# Patient Record
Sex: Male | Born: 1990 | Race: White | Hispanic: No | Marital: Single | Smoking: Never smoker
Health system: Southern US, Community
[De-identification: ages and names within clinical notes are randomized; demographics above are authoritative.]

## PROBLEM LIST (undated history)

## (undated) HISTORY — PX: MENISCUS REPAIR: SHX5179

---

## 2014-04-07 ENCOUNTER — Emergency Department (HOSPITAL_COMMUNITY): Payer: BC Managed Care – PPO

## 2014-04-07 ENCOUNTER — Emergency Department (HOSPITAL_COMMUNITY)
Admission: EM | Admit: 2014-04-07 | Discharge: 2014-04-07 | Disposition: A | Discharge status: Home / Self Care | Payer: BC Managed Care – PPO | Attending: Emergency Medicine | Admitting: Emergency Medicine

## 2014-04-07 ENCOUNTER — Encounter (HOSPITAL_COMMUNITY): Payer: Self-pay | Admitting: Emergency Medicine

## 2014-04-07 DIAGNOSIS — S61409A Unspecified open wound of unspecified hand, initial encounter: Secondary | ICD-10-CM | POA: Diagnosis not present

## 2014-04-07 DIAGNOSIS — Y9389 Activity, other specified: Secondary | ICD-10-CM | POA: Diagnosis not present

## 2014-04-07 DIAGNOSIS — S6390XA Sprain of unspecified part of unspecified wrist and hand, initial encounter: Secondary | ICD-10-CM | POA: Insufficient documentation

## 2014-04-07 DIAGNOSIS — S61412A Laceration without foreign body of left hand, initial encounter: Secondary | ICD-10-CM

## 2014-04-07 DIAGNOSIS — S6990XA Unspecified injury of unspecified wrist, hand and finger(s), initial encounter: Secondary | ICD-10-CM | POA: Insufficient documentation

## 2014-04-07 DIAGNOSIS — W010XXA Fall on same level from slipping, tripping and stumbling without subsequent striking against object, initial encounter: Secondary | ICD-10-CM | POA: Insufficient documentation

## 2014-04-07 DIAGNOSIS — W108XXA Fall (on) (from) other stairs and steps, initial encounter: Secondary | ICD-10-CM | POA: Insufficient documentation

## 2014-04-07 DIAGNOSIS — Y9289 Other specified places as the place of occurrence of the external cause: Secondary | ICD-10-CM | POA: Diagnosis not present

## 2014-04-07 DIAGNOSIS — S63602A Unspecified sprain of left thumb, initial encounter: Secondary | ICD-10-CM

## 2014-04-07 MED ORDER — IBUPROFEN 600 MG PO TABS: 600.0000 mg | ORAL_TABLET | Freq: Four times a day (QID) | ORAL | Status: AC | PRN

## 2014-04-07 NOTE — ED Notes (Signed)
Pain assessment should say L thumb

## 2014-04-07 NOTE — ED Provider Notes (Signed)
Medical screening examination/treatment/procedure(s) were performed by non-physician practitioner and as supervising physician I was immediately available for consultation/collaboration.   EKG Interpretation None        Michoel Kunin RGlynn Octave9/27/15 [phone]

## 2014-04-07 NOTE — ED Notes (Signed)
Pt states he was walking down wet stairs when he slipped and fell, landing on L thumb. Pt unable to move L thumb, swelling noted to area. Small amount of bleeding noted around nail bed.

## 2014-04-07 NOTE — ED Provider Notes (Signed)
CSN: [id number]     Arrival date & time 04/07/14  2048 History   None    This chart was scribed for non-physician practitioner, Antony Madura, PA-C working with No att. providers found by Arlan Organ, ED Scribe. This Brewer was seen in room WTR7/WTR7 and the Brewer's care was started at 10:58 PM.   Chief Complaint  Brewer presents with  . Hand Pain   The history is provided by the Brewer. No language interpreter was used.    HPI Comments: Bradley Brewer is a 23 y.o. male who presents to the Emergency Department complaining of constant, moderate, non-radiating pain to the L thumb onset just prior to arrival. He describes pain as throbbing.  Small amount of bleeding noted around nail bed. Pt states he was walking outside when he slipped on a wet staircase resulting in him falling and landing on his thumb. Pain is exacerbated with certain movements and alleviated with elevation. [name] has tried OTC Ibuprofen with mild improvement for discomfort. He denies any numbness, loss of sensation, or paresthesia to the thumb. Tetanus is UTD.  History reviewed. No pertinent past medical history. Past Surgical History  Procedure Laterality Date  . Meniscus repair Right    No family history on file. History  Substance Use Topics  . Smoking status: Never Smoker   . Smokeless tobacco: Not on file  . Alcohol Use: Yes     Comment: occasional    Review of Systems  Musculoskeletal: Positive for arthralgias.  Neurological: Negative for weakness and numbness.  All other systems reviewed and are negative.   Allergies  Review of Brewer's allergies indicates no known allergies.  Home Medications   Prior to Admission medications   Medication Sig Start Date End Date Taking? Authorizing Provider  ibuprofen (ADVIL,MOTRIN) 600 MG tablet Take 1 tablet (600 mg total) by mouth every 6 (six) hours as needed. 04/07/14   Antony Madura, PA-C   Triage Vitals: BP 161/73  Pulse 73  Temp(Src) 99.3 F (37.4 C)  (Oral)  Resp 18  Ht  (1.905 m)  Wt 225 lb (102.059 kg)  BMI 28.12 kg/m2  SpO2 100%   Physical Exam  Nursing note and vitals reviewed. Constitutional: He is oriented to person, place, and time. He appears well-developed and well-nourished. No distress.  Nontoxic/nonseptic appearing  HENT:  Head: Normocephalic and atraumatic.  Eyes: Conjunctivae and EOM are normal. No scleral icterus.  Neck: Normal range of motion. Neck supple.  Cardiovascular: Normal rate, regular rhythm and intact distal pulses.   Distal radial pulse 2+ in left upper extremity. Capillary refill brisk in all digits of left hand  Pulmonary/Chest: Effort normal. No respiratory distress.  Musculoskeletal: Normal range of motion.  Normal passive range of motion of left thumb. Decreased active range of motion of left thumb secondary to pain. Brewer has 5/5 strength against resistance flexors and extensors of of left hand. There is a skin tear appreciated to the lateral aspect of the nail of Brewer's L thumb.  Neurological: He is alert and oriented to person, place, and time. He exhibits normal muscle tone. Coordination normal.  Sensation to light touch intact. Finger to thumb opposition intact. Brewer able to wiggle all fingers of left hand  Skin: Skin is warm and dry. No rash noted. He is not diaphoretic. No erythema. No pallor.  Psychiatric: He has a normal mood and affect. His behavior is normal.    ED Course  Procedures (including critical care time)  DIAGNOSTIC STUDIES: Oxygen  Saturation is 100% on RA, Normal by my interpretation.    COORDINATION OF CARE: 10:58 PM- Will order DG Finger Thumb L. Discussed treatment plan with pt at bedside and pt agreed to plan.     Labs Review Labs Reviewed - No data to display  Imaging Review Dg Finger Thumb Left  04/07/2014   CLINICAL DATA:  Left thumb pain after fall.  EXAM: LEFT THUMB 2+V  COMPARISON:  None.  FINDINGS: There is no evidence of fracture or dislocation.  There is no evidence of arthropathy or other focal bone abnormality. Soft tissues are unremarkable  IMPRESSION: Normal left thumb.   Electronically Signed   By: Roque Lias M.D.   On: 04/07/2014 21:09     EKG Interpretation None      MDM   Final diagnoses:  Left thumb sprain, initial encounter  Skin tear of hand without complication, left, initial encounter    23 year old male presents to the emergency department for further evaluation of left thumb injury after he slipped on the stairs. Brewer neurovascularly intact on exam. No crepitus, deformity, or effusion. Brewer has a skin tear noted to the lateral aspect of his left first finger. Imaging shows no evidence of fracture, dislocation, or bony deformity. Brewer given finger splint for likely thumb sprain; wound care provided. Brewer stable and appropriate for discharge with instruction to return if symptoms worsen or signs of infection develop. Brewer agreeable to plan with no unaddressed concerns.  I personally performed the services described in this documentation, which was scribed in my presence. The recorded information has been reviewed and is accurate.    Filed Vitals:   04/07/14 2054  BP: 161/73  Pulse: 73  Temp: 99.3 F (37.4 C)  TempSrc: Oral  Resp: 18  Height:  (1.905 m)  Weight: 225 lb (102.059 kg)  SpO2: 100%     Antony Madura, PA-C 04/07/14 2308

## 2014-04-07 NOTE — Discharge Instructions (Signed)
Recommend ice, splinting for stability, and ibuprofen for symptoms. Follow up with your doctor to ensure resolution of symptoms. Return as needed if symptoms worsen.  Finger Sprain A finger sprain is a tear in one of the strong, fibrous tissues that connect the bones (ligaments) in your finger. The severity of the sprain depends on how much of the ligament is torn. The tear can be either partial or complete. CAUSES  Often, sprains are a result of a fall or accident. If you extend your hands to catch an object or to protect yourself, the force of the impact causes the fibers of your ligament to stretch too much. This excess tension causes the fibers of your ligament to tear. SYMPTOMS  You may have some loss of motion in your finger. Other symptoms include:  Bruising.  Tenderness.  Swelling. DIAGNOSIS  In order to diagnose finger sprain, your caregiver will physically examine your finger or thumb to determine how torn the ligament is. Your caregiver may also suggest an X-ray exam of your finger to make sure no bones are broken. TREATMENT  If your ligament is only partially torn, treatment usually involves keeping the finger in a fixed position (immobilization) for a short period. To do this, your caregiver will apply a bandage, cast, or splint to keep your finger from moving until it heals. For a partially torn ligament, the healing process usually takes 2 to 3 weeks. If your ligament is completely torn, you may need surgery to reconnect the ligament to the bone. After surgery a cast or splint will be applied and will need to stay on your finger or thumb for 4 to 6 weeks while your ligament heals. HOME CARE INSTRUCTIONS  Keep your injured finger elevated, when possible, to decrease swelling.  To ease pain and swelling, apply ice to your joint twice a day, for 2 to 3 days:  Put ice in a plastic bag.  Place a towel between your skin and the bag.  Leave the ice on for 15 minutes.  Only take  over-the-counter or prescription medicine for pain as directed by your caregiver.  Do not wear rings on your injured finger.  Do not leave your finger unprotected until pain and stiffness go away (usually 3 to 4 weeks).  Do not allow your cast or splint to get wet. Cover your cast or splint with a plastic bag when you shower or bathe. Do not swim.  Your caregiver may suggest special exercises for you to do during your recovery to prevent or limit permanent stiffness. SEEK IMMEDIATE MEDICAL CARE IF:  Your cast or splint becomes damaged.  Your pain becomes worse rather than better. MAKE SURE YOU:  Understand these instructions.  Will watch your condition.  Will get help right away if you are not doing well or get worse. Document Released: 08/05/2004 Document Revised: 09/20/2011 Document Reviewed: 03/01/2011 [facility] Patient Information 2015 [place], [place]. This information is not intended to replace advice given to you by your health care provider. Make sure you discuss any questions you have with your health care provider.

## 2014-10-19 ENCOUNTER — Emergency Department (HOSPITAL_COMMUNITY)
Admission: EM | Admit: 2014-10-19 | Discharge: 2014-10-19 | Disposition: A | Discharge status: Home / Self Care | Payer: BC Managed Care – PPO | Attending: Emergency Medicine | Admitting: Emergency Medicine

## 2014-10-19 ENCOUNTER — Encounter (HOSPITAL_COMMUNITY): Payer: Self-pay | Admitting: Emergency Medicine

## 2014-10-19 ENCOUNTER — Emergency Department (HOSPITAL_COMMUNITY): Payer: BC Managed Care – PPO

## 2014-10-19 DIAGNOSIS — S61319A Laceration without foreign body of unspecified finger with damage to nail, initial encounter: Secondary | ICD-10-CM

## 2014-10-19 DIAGNOSIS — Y998 Other external cause status: Secondary | ICD-10-CM | POA: Diagnosis not present

## 2014-10-19 DIAGNOSIS — Y9289 Other specified places as the place of occurrence of the external cause: Secondary | ICD-10-CM | POA: Insufficient documentation

## 2014-10-19 DIAGNOSIS — S61312A Laceration without foreign body of right middle finger with damage to nail, initial encounter: Secondary | ICD-10-CM | POA: Insufficient documentation

## 2014-10-19 DIAGNOSIS — W2107XA Struck by softball, initial encounter: Secondary | ICD-10-CM | POA: Diagnosis not present

## 2014-10-19 DIAGNOSIS — S6991XA Unspecified injury of right wrist, hand and finger(s), initial encounter: Secondary | ICD-10-CM | POA: Diagnosis present

## 2014-10-19 DIAGNOSIS — Y9364 Activity, baseball: Secondary | ICD-10-CM | POA: Diagnosis not present

## 2014-10-19 MED ORDER — BUPIVACAINE HCL (PF) 0.5 % IJ SOLN
10.0000 mL | Freq: Once | INTRAMUSCULAR | Status: AC
  Administered 2014-10-19: 10 mL
  Filled 2014-10-19: qty 10

## 2014-10-19 MED ORDER — NAPROXEN 500 MG PO TABS: 500.0000 mg | ORAL_TABLET | Freq: Two times a day (BID) | ORAL | Status: AC

## 2014-10-19 NOTE — ED Notes (Signed)
Contacted X-Ray, per A. Harris PA-C.

## 2014-10-19 NOTE — ED Notes (Signed)
Patient here with complaint of right middle finger injury from being struck by baseball. Skin appears to have split beneath nail. Bleeding controlled at this time. Patient able to move finger freely without pain. No apparent swelling.

## 2014-10-19 NOTE — Discharge Instructions (Signed)
Fingernail Removal Fingernails may need to be removed because of injury, infections, or correction of abnormal growth. A special non-stick bandage has been put on your finger tightly to prevent bleeding. Fingernails will usually grow back if the finger has not been badly injured and you carefully follow instructions. HOME CARE INSTRUCTIONS   Keep your hand elevated above your heart to relieve pain and swelling.  Keep your dressing dry and clean.  Change your bandage in 24 hours.  After your bandage is changed, soak your hand in warm soapy water for 10 to 20 minutes. Do this 3 times per day. This helps reduce pain and swelling. After soaking your hand, apply a clean, dry bandage. Change your bandage if it is wet or dirty.  Only take over-the-counter or prescription medicines for pain, discomfort, or fever as directed by your caregiver.  See your caregiver as needed for problems.  You may have received an instruction to follow up with your caregiver or a specialist. The failure to follow up as instructed could result in the permanent loss of a fingernail. SEEK IMMEDIATE MEDICAL CARE IF:   You have increased pain, swelling, drainage, or bleeding.  You have a fever. MAKE SURE YOU:   Understand these instructions.  Will watch your condition.  Will get help right away if you are not doing well or get worse. Document Released: 06/25/2000 Document Revised: 09/20/2011 Document Reviewed: 10/31/2007 [facility] Patient Information 2015 [place]. This information is not intended to replace advice given to you by your health care provider. Make sure you discuss any questions you have with your health care provider.  Fingertip Injuries and Amputations Fingertip injuries are common and often get injured because they are last to escape when pulling your hand out of harm's way. You have amputated (cut off) part of your finger. How this turns out depends largely on how much was amputated. If just  the tip is amputated, often the end of the finger will grow back and the finger may return to much the same as it was before the injury.  If more of the finger is missing, your caregiver has done the best with the tissue remaining to allow you to keep as much finger as is possible. Your caregiver after checking your injury has tried to leave you with a painless fingertip that has durable, feeling skin. If possible, your caregiver has tried to maintain the finger's length and appearance and preserve its fingernail.  Please read the instructions outlined below and refer to this sheet in the next few weeks. These instructions provide you with general information on caring for yourself. Your caregiver may also give you specific instructions. While your treatment has been done according to the most current medical practices available, unavoidable complications occasionally occur. If you have any problems or questions after discharge, please call your caregiver. HOME CARE INSTRUCTIONS   You may resume normal diet and activities as directed or allowed.  Keep your hand elevated above the level of your heart. This helps decrease pain and swelling.  Keep ice packs (or a bag of ice wrapped in a towel) on the injured area for 15-20 minutes, 03-04 times per day, for the first two days.  Change dressings if necessary or as directed.  Clean the wound daily or as directed.  Only take over-the-counter or prescription medicines for pain, discomfort, or fever as directed by your caregiver.  Keep appointments as directed. SEEK IMMEDIATE MEDICAL CARE IF:  You develop redness, swelling, numbness or  increasing pain in the wound.  There is pus coming from the wound.  You develop an unexplained oral temperature above 102 F (38.9 C) or as your caregiver suggests.  There is a foul (bad) smell coming from the wound or dressing.  There is a breaking open of the wound (edges not staying together) after sutures or  staples have been removed. MAKE SURE YOU:   Understand these instructions.  Will watch your condition.  Will get help right away if you are not doing well or get worse. Document Released: 05/19/2005 Document Revised: 09/20/2011 Document Reviewed: 04/17/2008 [facility] Patient Information 2015 [place], [place]. This information is not intended to replace advice given to you by your health care provider. Make sure you discuss any questions you have with your health care provider.

## 2014-10-19 NOTE — ED Provider Notes (Signed)
CSN: [id number]     Arrival date & time 10/19/14  1915 History   First MD Initiated Contact with Patient 10/19/14 2003     Chief Complaint  Patient presents with  . Finger Injury     (Consider location/radiation/quality/duration/timing/severity/associated sxs/prior Treatment) The history is provided by the patient. No language interpreter was used.  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 The patient was playing in a game today when a ball flew past the distal tip of his right middle finger. Patient suffered a laceration involving the hyponychium and the distal end of the patient's finger. He denies any pain in the fingertip. He denies numbness or tingling. The injury occurred about 6 hours prior to arrival. He is up-to-date on his tetanus vaccination.  History reviewed. No pertinent past medical history. Past Surgical History  Procedure Laterality Date  . Meniscus repair Right    History reviewed. No pertinent family history. History  Substance Use Topics  . Smoking status: Never Smoker   . Smokeless tobacco: Not on file  . Alcohol Use: Yes     Comment: occasional    Review of Systems  Constitutional: Negative for fever and chills.  Gastrointestinal: Negative for nausea and vomiting.  Skin: Positive for wound. Negative for color change.  Neurological: Negative for weakness and numbness.      Allergies  Review of patient's allergies indicates no known allergies.  Home Medications   Prior to Admission medications   Medication Sig Start Date End Date Taking? Authorizing Provider  ibuprofen (ADVIL,MOTRIN) 600 MG tablet Take 1 tablet (600 mg total) by mouth every 6 (six) hours as needed. 04/07/14   Antony Madura, PA-C   BP 121/66 mmHg  Pulse 81  Temp(Src) 98.4 F (36.9 C) (Oral)  Resp 14  SpO2 97% Physical Exam  Constitutional: He is oriented to person, place, and time. He appears well-developed and well-nourished. No distress.   HENT:  Head: Normocephalic and atraumatic.  Eyes: Conjunctivae are normal. No scleral icterus.  Neck: Normal range of motion. Neck supple.  Cardiovascular: Normal rate, regular rhythm and normal heart sounds.   Pulmonary/Chest: Effort normal and breath sounds normal. No respiratory distress.  Abdominal: Soft. There is no tenderness.  Musculoskeletal: He exhibits no edema.  Neurological: He is alert and oriented to person, place, and time.  Skin: Skin is warm and dry. He is not diaphoretic.  1.5 cm avulsion/laceration  Injury if the distal middle Right finger. The distal tip avulsed away from the hypochium . The laceration is under the free edge of the nail and involves the nail bed. No visible bone. No tenderness to palpation of the bony portions of the finger. NVI  Psychiatric: His behavior is normal.  Nursing note and vitals reviewed.   ED Course  NAIL REMOVAL Date/Time: 10/19/2014 10:10 PM Performed by: Arthor Captain Authorized by: Arthor Captain Consent: Verbal consent obtained. Risks and benefits: risks, benefits and alternatives were discussed Consent given by: patient Patient identity confirmed: verbally with patient Location: right hand Location details: right long finger Anesthesia: digital block Local anesthetic: bupivacaine 0.5% with epinephrine Anesthetic total: 8 ml Patient sedated: no Preparation: skin prepped with ChloraPrep Amount removed: complete Wedge excision of skin of nail fold: no Nail bed sutured: yes Suture material: 6-0 Chromic gut Number of sutures: 3 Nail matrix removed: complete Removed nail replaced and anchored: yes (nail glued with skin glue) Dressing: Xeroform gauze Patient tolerance: Patient tolerated the procedure well with no  immediate complications  LACERATION REPAIR Date/Time: 10/19/2014 10:12 PM Performed by: Arthor CaptainHARRIS, Daena Alper Authorized by: Arthor CaptainHARRIS, Hatsue Sime Consent: Verbal consent obtained. Risks and benefits: risks, benefits and  alternatives were discussed Required items: required blood products, implants, devices, and special equipment available Body area: upper extremity Location details: right long finger Laceration length: 1.5 cm Foreign bodies: no foreign bodies Tendon involvement: none Nerve involvement: none Vascular damage: no Irrigation method: syringe Amount of cleaning: extensive Debridement: none Subcutaneous closure: 6-0 Chromic gut Number of sutures: 3 Technique: simple Approximation difficulty: complex   (including critical care time) Labs Review Labs Reviewed - No data to display  Imaging Review No results found.   EKG Interpretation None      MDM   Final diagnoses:  Laceration of nail bed of finger, initial encounter    .  Patient with nail bed laceration. No signs of open fracture on x-ray. Nail plate removed and laceration repaired with successful reattachment of nail for splinting. Patient tolerated the procedure well. We'll discharge with splinted finger. Follow up with hand. He appears safe for discharge at this time  Arthor Captainbigail Juanito Gonyer, PA-C 10/26/14 1633  Purvis SheffieldForrest Harrison, MD 10/27/14 [phone]

## 2016-06-30 IMAGING — DX DG FINGER MIDDLE 2+V*R*
3 series · 3 of 3 positions shown · non-contrast
Comparison: None.

CLINICAL DATA: Finger injury. Laceration along the distal aspect of
the right middle finger. Injury while catching a ground fall at
baseball today. Initial encounter.

EXAM:
RIGHT MIDDLE FINGER 2+V

[finger ap]
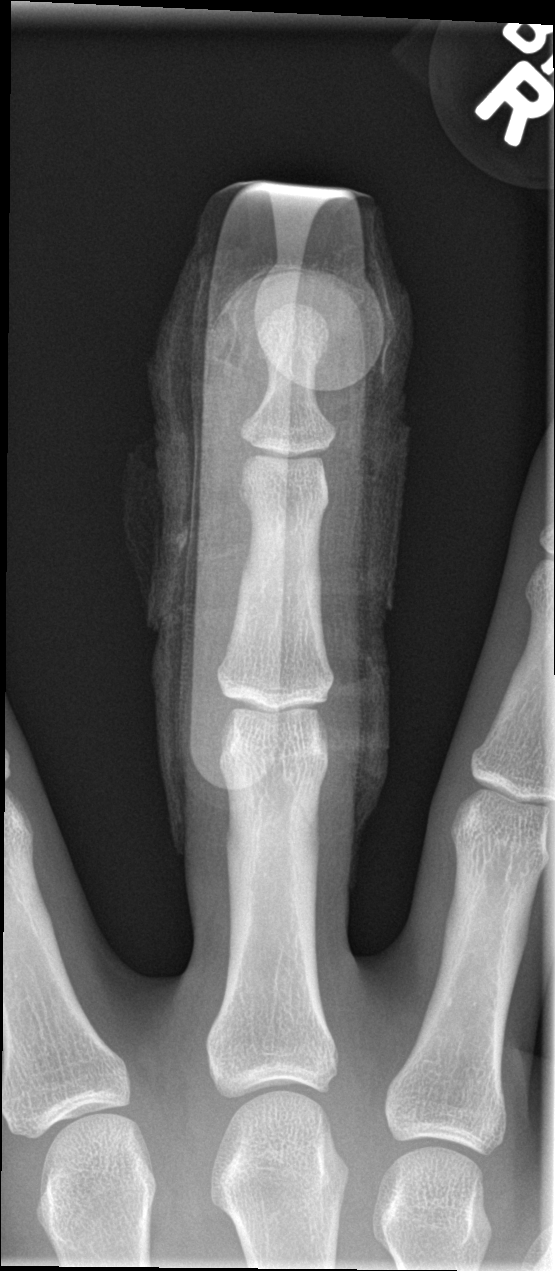

[finger obl]
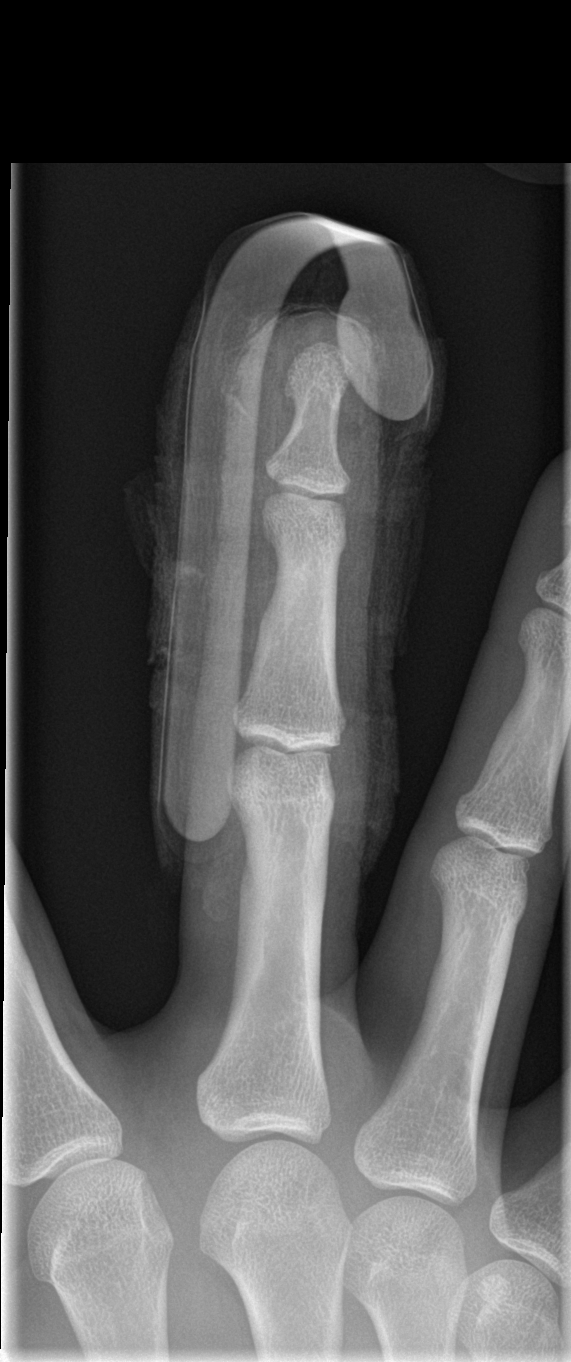

[finger lat]
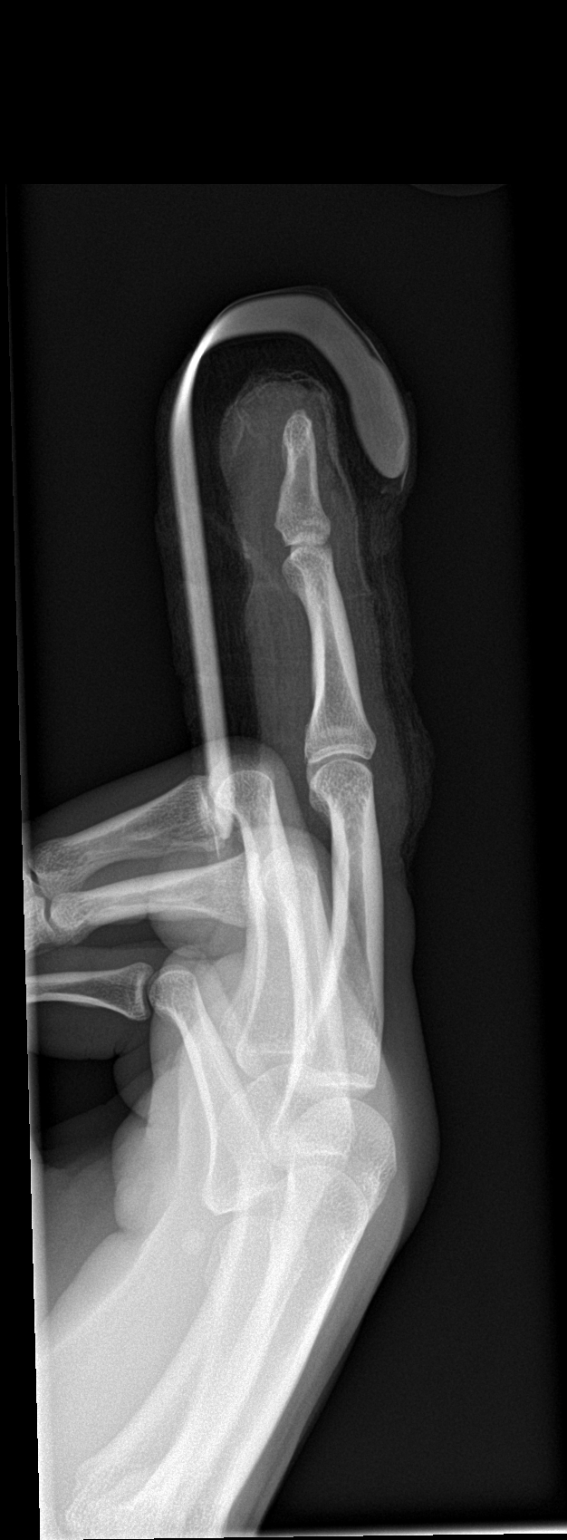

[3 of 3 positions shown; findings below may reference images not displayed]

FINDINGS: The right middle finger is imaged within a bandage in splint. The
joint is located. There is no underlying fracture. No radiopaque
foreign body is present.
IMPRESSION: 1. No acute fracture or radiopaque foreign body.
2. Soft tissue swelling is somewhat obscured by the dressing and
splint.
